# Patient Record
Sex: Female | Born: 1955 | Race: White | Hispanic: No | Marital: Married | Smoking: Never smoker
Health system: Southern US, Community
[De-identification: ages and names within clinical notes are randomized; demographics above are authoritative.]

## PROBLEM LIST (undated history)

## (undated) HISTORY — PX: ABDOMINAL HYSTERECTOMY: SHX81

## (undated) HISTORY — PX: OOPHORECTOMY: SHX86

---

## 2004-11-27 ENCOUNTER — Ambulatory Visit: Payer: Self-pay | Admitting: Unknown Physician Specialty

## 2005-11-30 ENCOUNTER — Ambulatory Visit: Payer: Self-pay | Admitting: Unknown Physician Specialty

## 2006-10-15 ENCOUNTER — Ambulatory Visit: Payer: Self-pay | Admitting: Unknown Physician Specialty

## 2006-12-02 ENCOUNTER — Ambulatory Visit: Payer: Self-pay | Admitting: Unknown Physician Specialty

## 2007-11-29 ENCOUNTER — Ambulatory Visit: Payer: Self-pay | Admitting: Unknown Physician Specialty

## 2009-03-28 ENCOUNTER — Ambulatory Visit: Payer: Self-pay | Admitting: Unknown Physician Specialty

## 2010-04-01 ENCOUNTER — Ambulatory Visit: Payer: Self-pay | Admitting: Unknown Physician Specialty

## 2011-04-15 ENCOUNTER — Ambulatory Visit: Payer: Self-pay | Admitting: Unknown Physician Specialty

## 2012-04-13 ENCOUNTER — Ambulatory Visit: Payer: Self-pay | Admitting: Internal Medicine

## 2012-04-22 ENCOUNTER — Ambulatory Visit: Payer: Self-pay | Admitting: Internal Medicine

## 2012-04-28 ENCOUNTER — Ambulatory Visit: Payer: Self-pay | Admitting: Orthopedic Surgery

## 2012-06-07 ENCOUNTER — Ambulatory Visit: Payer: Self-pay | Admitting: Unknown Physician Specialty

## 2013-06-14 ENCOUNTER — Ambulatory Visit: Payer: Self-pay | Admitting: Unknown Physician Specialty

## 2013-07-03 ENCOUNTER — Ambulatory Visit: Payer: Self-pay

## 2014-06-15 ENCOUNTER — Ambulatory Visit: Payer: Self-pay | Admitting: Internal Medicine

## 2014-10-08 DIAGNOSIS — I10 Essential (primary) hypertension: Secondary | ICD-10-CM | POA: Insufficient documentation

## 2014-10-08 DIAGNOSIS — E785 Hyperlipidemia, unspecified: Secondary | ICD-10-CM | POA: Insufficient documentation

## 2015-04-14 NOTE — Op Note (Signed)
PATIENT NAME:  Morgan Fernandez, Morgan Fernandez MR#:  [id number] DATE OF BIRTH:  [birth year]  DATE OF PROCEDURE:  04/28/2012  PREOPERATIVE DIAGNOSIS: Left comminuted distal radius fracture.   POSTOPERATIVE DIAGNOSIS: Left comminuted distal radius fracture.   PROCEDURE: Open reduction internal fixation left distal radius.   ANESTHESIA: General.   SURGEON: Leitha SchullerMichael J. Ginnifer Creelman, MD   DESCRIPTION OF PROCEDURE: The patient was brought to the operating room and after adequate anesthesia was obtained the left arm was prepped and draped in the usual sterile fashion with a tourniquet applied to the upper arm. After patient identification and time-out procedures were completed, the arm was exsanguinated with an Esmarch and the tourniquet raised. Fingertrap traction was applied to the index and middle finger and traction placed to restore length to the distal radius fragment. Volar approach was made and centered over the FCR tendon with the sheath incised and the tendon retracted ulnarly, the radial artery retracted radially. The pronator was identified and lifted off the distal fragment and shaft. With traction most of the length was restored and with some gentle manipulation near anatomic alignment could be obtained despite extensive comminution of the fracture. A short narrow DVR plate was then applied in the appropriate location with three screws placed proximally. This acted as a buttress and then smooth pegs to help maintain alignment in the distal fragments first drilling, measuring, and then placing the smooth pegs. After all had been tightened and adequate reduction on mini C-arm views as well as oblique views looking down the radial styloid and along the joint surface, there did not appear to be any pin penetration into the joint. Length was restored along with radial inclination and neutral volar tilt. The tourniquet was let down. Hemostasis was checked with electrocautery. The wound was closed with 3-0 Vicryl  subcutaneously and 4-0 nylon. Xeroform, 4 x 4's, Webril, and a volar splint were applied. The patient was sent to the recovery room in stable condition.   ESTIMATED BLOOD LOSS: Minimal.   COMPLICATIONS: None.   SPECIMEN: None.   TOURNIQUET TIME: 25 minutes at 250 mmHg.   IMPLANTS: [employer] short narrow DVR plate with multiple screws and pegs used.   ____________________________ Leitha SchullerMichael J. Adler Alton, MD mjm:drc D: 04/28/2012 22:15:17 ET T: 04/29/2012 09:11:31 ET JOB#: [id number]  cc: Leitha SchullerMichael J. Deaven Barron, MD, <Dictator> Leitha SchullerMICHAEL J Delrico Minehart MD ELECTRONICALLY SIGNED 04/29/2012 17:05

## 2015-05-23 ENCOUNTER — Other Ambulatory Visit: Payer: Self-pay | Admitting: Internal Medicine

## 2015-05-23 DIAGNOSIS — Z1231 Encounter for screening mammogram for malignant neoplasm of breast: Secondary | ICD-10-CM

## 2015-06-18 ENCOUNTER — Ambulatory Visit
Admission: RE | Admit: 2015-06-18 | Discharge: 2015-06-18 | Disposition: A | Discharge status: Home / Self Care | Payer: BLUE CROSS/BLUE SHIELD | Source: Ambulatory Visit | Attending: Internal Medicine | Admitting: Internal Medicine

## 2015-06-18 DIAGNOSIS — Z1231 Encounter for screening mammogram for malignant neoplasm of breast: Secondary | ICD-10-CM | POA: Diagnosis present

## 2015-10-15 DIAGNOSIS — R7401 Elevation of levels of liver transaminase levels: Secondary | ICD-10-CM | POA: Insufficient documentation

## 2015-10-15 DIAGNOSIS — R740 Nonspecific elevation of levels of transaminase and lactic acid dehydrogenase [LDH]: Secondary | ICD-10-CM

## 2016-04-15 ENCOUNTER — Other Ambulatory Visit: Payer: Self-pay | Admitting: Internal Medicine

## 2016-04-15 DIAGNOSIS — Z1239 Encounter for other screening for malignant neoplasm of breast: Secondary | ICD-10-CM

## 2016-06-18 ENCOUNTER — Ambulatory Visit
Admission: RE | Admit: 2016-06-18 | Discharge: 2016-06-18 | Disposition: A | Discharge status: Home / Self Care | Payer: BLUE CROSS/BLUE SHIELD | Source: Ambulatory Visit | Attending: Internal Medicine | Admitting: Internal Medicine

## 2016-06-18 DIAGNOSIS — Z1239 Encounter for other screening for malignant neoplasm of breast: Secondary | ICD-10-CM

## 2016-06-18 DIAGNOSIS — Z1231 Encounter for screening mammogram for malignant neoplasm of breast: Secondary | ICD-10-CM | POA: Insufficient documentation

## 2017-03-25 ENCOUNTER — Other Ambulatory Visit: Payer: Self-pay | Admitting: Internal Medicine

## 2017-03-25 DIAGNOSIS — Z1231 Encounter for screening mammogram for malignant neoplasm of breast: Secondary | ICD-10-CM

## 2017-06-28 ENCOUNTER — Ambulatory Visit
Admission: RE | Admit: 2017-06-28 | Discharge: 2017-06-28 | Disposition: A | Discharge status: Home / Self Care | Payer: BLUE CROSS/BLUE SHIELD | Source: Ambulatory Visit | Attending: Internal Medicine | Admitting: Internal Medicine

## 2017-06-28 DIAGNOSIS — Z1231 Encounter for screening mammogram for malignant neoplasm of breast: Secondary | ICD-10-CM

## 2018-04-14 ENCOUNTER — Other Ambulatory Visit: Payer: Self-pay | Admitting: Internal Medicine

## 2018-04-14 DIAGNOSIS — Z1231 Encounter for screening mammogram for malignant neoplasm of breast: Secondary | ICD-10-CM

## 2018-05-22 ENCOUNTER — Ambulatory Visit
Admission: EM | Admit: 2018-05-22 | Discharge: 2018-05-22 | Disposition: A | Discharge status: Home / Self Care | Payer: BLUE CROSS/BLUE SHIELD

## 2018-05-22 DIAGNOSIS — W57XXXA Bitten or stung by nonvenomous insect and other nonvenomous arthropods, initial encounter: Secondary | ICD-10-CM | POA: Diagnosis not present

## 2018-05-22 DIAGNOSIS — S50861A Insect bite (nonvenomous) of right forearm, initial encounter: Secondary | ICD-10-CM | POA: Diagnosis not present

## 2018-05-22 MED ORDER — LORATADINE 10 MG PO TABS: 10.0000 mg | ORAL_TABLET | Freq: Every day | ORAL | 0 refills | Status: DC

## 2018-05-22 MED ORDER — CEPHALEXIN 500 MG PO CAPS: 500.0000 mg | ORAL_CAPSULE | Freq: Four times a day (QID) | ORAL | 0 refills | Status: AC

## 2018-05-22 NOTE — ED Triage Notes (Signed)
Pt woke up to a bite on her right arm near her elbow. It is red, tender to the touch and swelling. Did take benadryl before she came.

## 2018-05-22 NOTE — Discharge Instructions (Addendum)
Please continue to monitor area of redness along the right forearm.  If redness exceeds skin pen mark, please start antibiotic.  For the next 24 hours please apply hydrocortisone cream, ice and take antihistamine medications.

## 2018-05-22 NOTE — ED Provider Notes (Signed)
[facility]    CSN: [id number] Arrival date & time: 05/22/18  1221     History   Chief Complaint Chief Complaint  Patient presents with  . Insect Bite    HPI Morgan Fernandez is a 62 y.o. female.   Presents to the urgent care facility for evaluation of right forearm warmth and redness.  Patient states sometime in the middle the night she thinks she was bitten by an insect or spider.  She has a welt on the forearm with some mild redness and warmth.  Pain is minimal.  She is applied some hydrocortisone cream with mild improvement.  She took one Benadryl tablet with mild relief.  Rash is pruritic.  She denies any fevers.  HPI  History reviewed. No pertinent past medical history.  Patient Active Problem List   Diagnosis Date Noted  . Elevated transaminase level 10/15/2015  . Benign essential hypertension 10/08/2014  . Hyperlipidemia 10/08/2014    Past Surgical History:  Procedure Laterality Date  . ABDOMINAL HYSTERECTOMY    . OOPHORECTOMY      OB History   None      Home Medications    Prior to Admission medications   Medication Sig Start Date End Date Taking? Authorizing Provider  Ascorbic Acid (VITAMIN C) 1000 MG tablet Take by mouth.   Yes [provider]  aspirin EC 81 MG tablet Take by mouth.   Yes [provider]  Cholecalciferol (VITAMIN D3) 1000 units CAPS Take by mouth.   Yes [provider]  hydrochlorothiazide (HYDRODIURIL) 25 MG tablet  05/04/18  Yes [provider]  metoprolol succinate (TOPROL-XL) 25 MG 24 hr tablet TAKE 2 TABLETS ONCE DAILY 04/27/18  Yes [provider]  Multiple Vitamin (MULTIVITAMIN) capsule Take by mouth.   Yes [provider]  simvastatin (ZOCOR) 20 MG tablet TAKE 1 TABLET NIGHTLY 02/28/18  Yes [provider]  VITAMIN B COMPLEX-C CAPS Take by mouth.   Yes [provider]  cephALEXin (KEFLEX) 500 MG capsule Take 1 capsule (500 mg total) by mouth 4  (four) times daily for 7 days. 05/22/18 05/29/18  Evon SlackGaines, Sanel Stemmer C, PA-C  loratadine (CLARITIN) 10 MG tablet Take 1 tablet (10 mg total) by mouth daily. 05/22/18   Evon SlackGaines, Kasson Lamere C, PA-C    Family History Family History  Problem Relation Age of Onset  . Breast cancer Paternal Aunt 6950    Social History Social History   Tobacco Use  . Smoking status: Never Smoker  . Smokeless tobacco: Never Used  Substance Use Topics  . Alcohol use: Not on file  . Drug use: Not on file     Allergies   Azithromycin; Codeine; Erythromycin; Morphine; and Sulfa antibiotics   Review of Systems Review of Systems  Constitutional: Negative for fever.  Musculoskeletal: Negative for arthralgias, joint swelling and myalgias.  Skin: Positive for rash.     Physical Exam Triage Vital Signs ED Triage Vitals  Enc Vitals Group     BP 05/22/18 1245 (!) 165/77     Pulse Rate 05/22/18 1245 64     Resp 05/22/18 1245 18     Temp 05/22/18 1245 98.3 F (36.8 C)     Temp Source 05/22/18 1245 Oral     SpO2 05/22/18 1245 100 %     Weight --      Height --      Head Circumference --      Peak Flow --  Pain Score 05/22/18 1247 0     Pain Loc --      Pain Edu? --      Excl. in GC? --    No data found.  Updated Vital Signs BP (!) 165/77 (BP Location: Right Arm)   Pulse 64   Temp 98.3 F (36.8 C) (Oral)   Resp 18   SpO2 100%   Visual Acuity Right Eye Distance:   Left Eye Distance:   Bilateral Distance:    Right Eye Near:   Left Eye Near:    Bilateral Near:     Physical Exam  Constitutional: She is oriented to person, place, and time. She appears well-developed and well-nourished.  HENT:  Head: Normocephalic and atraumatic.  Eyes: Conjunctivae are normal.  Neck: Normal range of motion.  Cardiovascular: Normal rate.  Pulmonary/Chest: Effort normal. No respiratory distress.  Musculoskeletal: Normal range of motion.  Right upper extremity with 11.5 x 8 cm area of erythema with minimal  induration.  There is a centralized area of increased induration with slight elevation with no fluctuance.  No necrotic tissue present.  Small papule in the center of the area of erythema with no pustule formation.  Forearm is soft, neurovascular intact.  Slightly erythematous with mild warmth.  Tissue blanches.  Neurological: She is alert and oriented to person, place, and time.  Skin: Skin is warm. No rash noted.  Psychiatric: She has a normal mood and affect. Her behavior is normal. Thought content normal.     UC Treatments / Results  Labs (all labs ordered are listed, but only abnormal results are displayed) Labs Reviewed - No data to display  EKG None  Radiology No results found.  Procedures Procedures (including critical care time)  Medications Ordered in UC Medications - No data to display  Initial Impression / Assessment and Plan / UC Course  I have reviewed the triage vital signs and the nursing notes.  Pertinent labs & imaging results that were available during my care of the patient were reviewed by me and considered in my medical decision making (see chart for details).     62 year old female with insect bite to the right forearm.  There is some erythema mild warmth most likely non-cellulitic.  Patient likely with inflammatory response from insect bite.  Recommend she use hydrocortisone cream, antihistamines and ice.  Area of redness is marked with a skin pen and if no improvement 24 hours she will start cephalexin. Final Clinical Impressions(s) / UC Diagnoses   Final diagnoses:  Insect bite of right forearm, initial encounter     Discharge Instructions     Please continue to monitor area of redness along the right forearm.  If redness exceeds skin pen mark, please start antibiotic.  For the next 24 hours please apply hydrocortisone cream, ice and take antihistamine medications.   ED Prescriptions    Medication Sig Dispense Auth. Provider   cephALEXin  (KEFLEX) 500 MG capsule Take 1 capsule (500 mg total) by mouth 4 (four) times daily for 7 days. 28 capsule Evon Slack, PA-C   loratadine (CLARITIN) 10 MG tablet Take 1 tablet (10 mg total) by mouth daily. 10 tablet Ronnette Juniper       Evon Slack, [place] 05/22/18 1321

## 2018-06-29 ENCOUNTER — Ambulatory Visit
Admission: RE | Admit: 2018-06-29 | Discharge: 2018-06-29 | Disposition: A | Discharge status: Home / Self Care | Payer: BLUE CROSS/BLUE SHIELD | Source: Ambulatory Visit | Attending: Internal Medicine | Admitting: Internal Medicine

## 2018-06-29 ENCOUNTER — Encounter (INDEPENDENT_AMBULATORY_CARE_PROVIDER_SITE_OTHER): Payer: Self-pay

## 2018-06-29 DIAGNOSIS — Z1231 Encounter for screening mammogram for malignant neoplasm of breast: Secondary | ICD-10-CM | POA: Insufficient documentation

## 2019-03-31 IMAGING — MG MM DIGITAL SCREENING BILAT W/ TOMO W/ CAD
8 of 12 series · 8 of 28 positions shown · non-contrast
Comparison: Previous exam(s).

CLINICAL DATA: Screening.

EXAM:
2D DIGITAL SCREENING BILATERAL MAMMOGRAM WITH CAD AND ADJUNCT TOMO

[R MLO synth-2D]
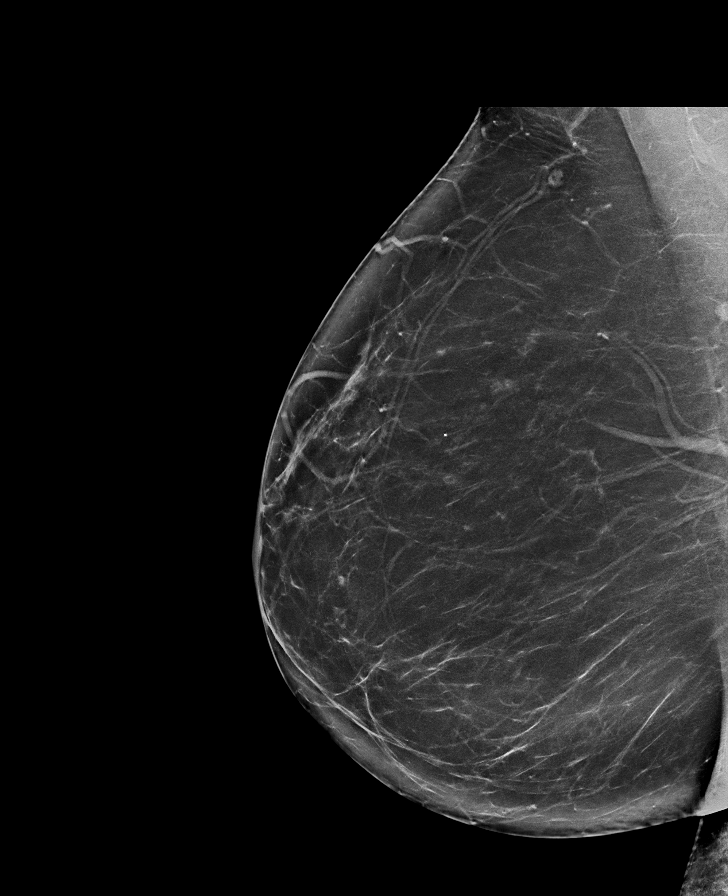

[L MLO]
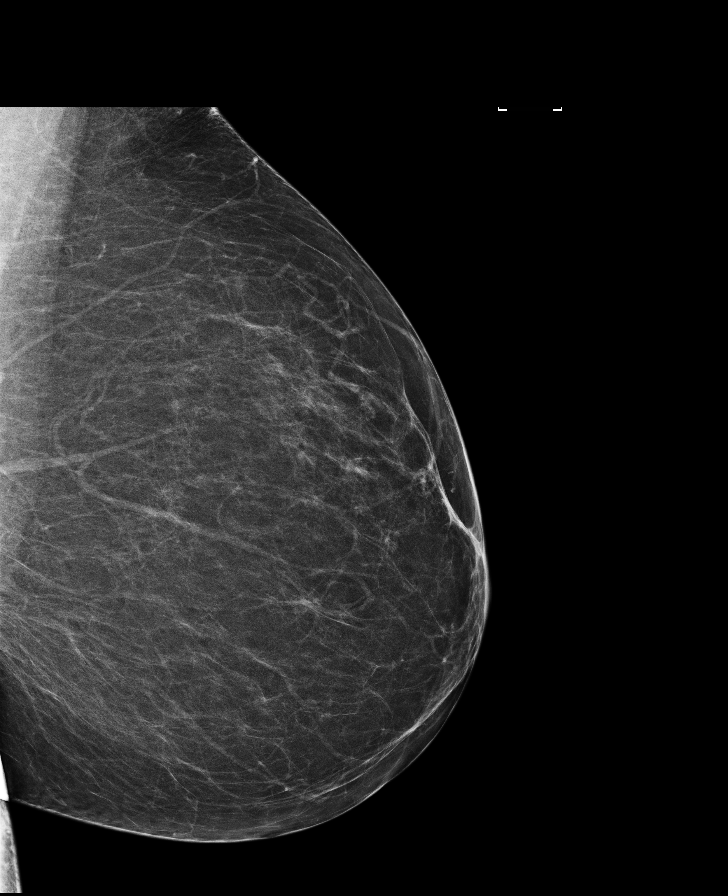

[L CC synth-2D]
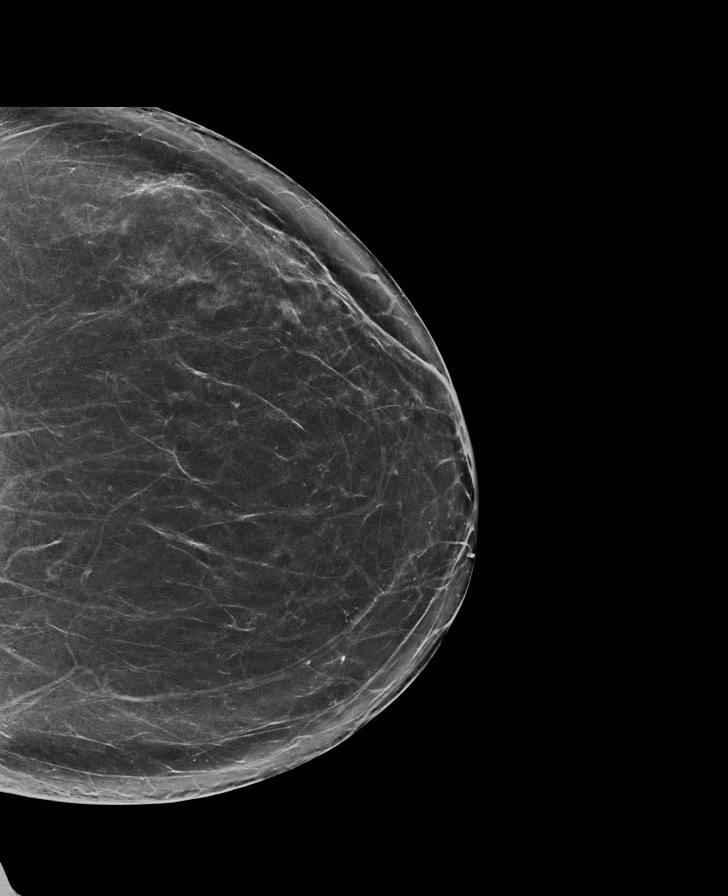

[R CC synth-2D]
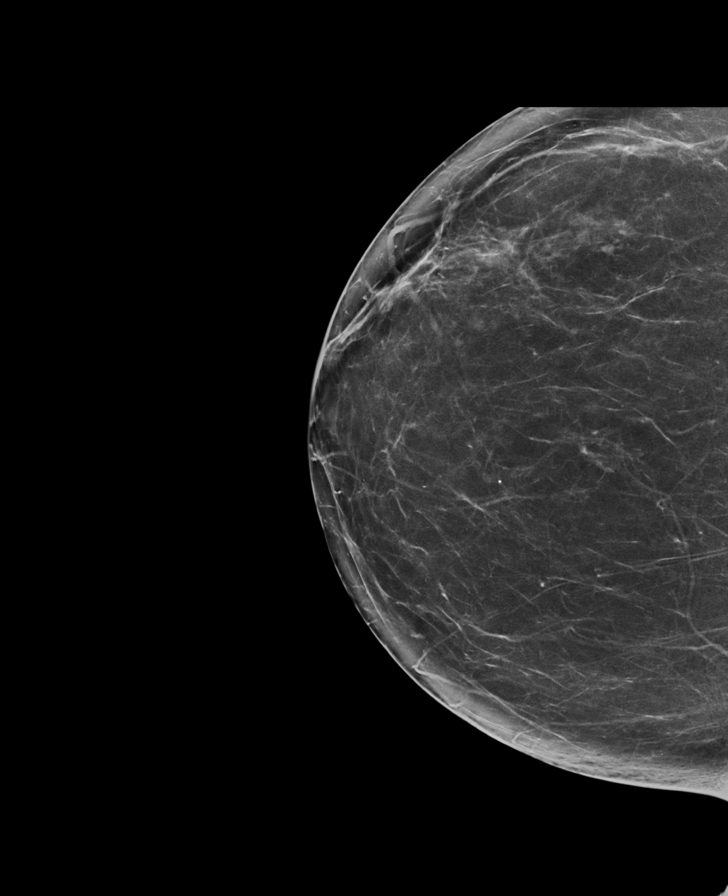

[R MLO]
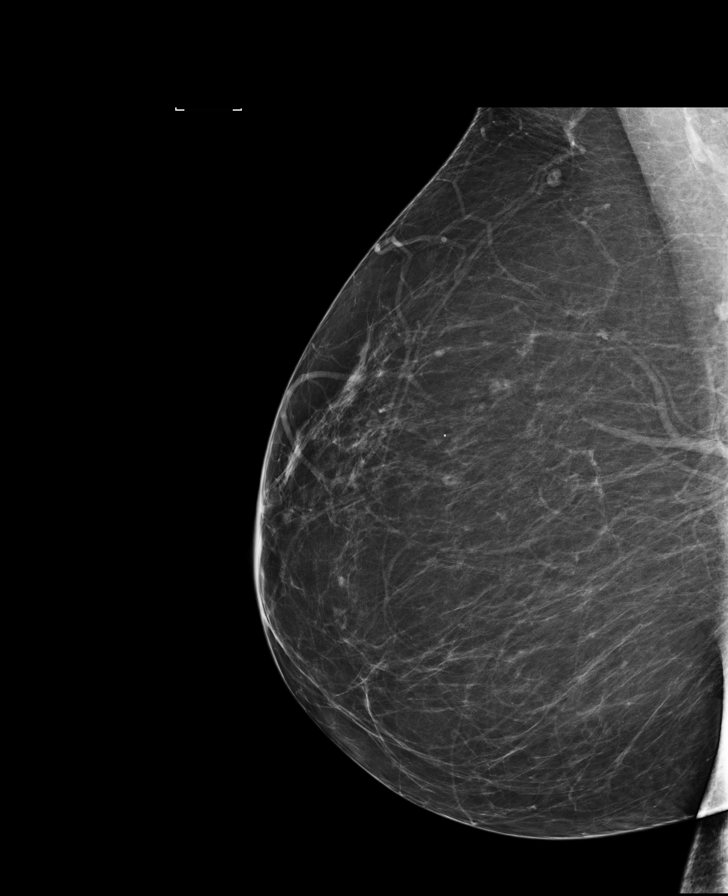

[L MLO synth-2D]
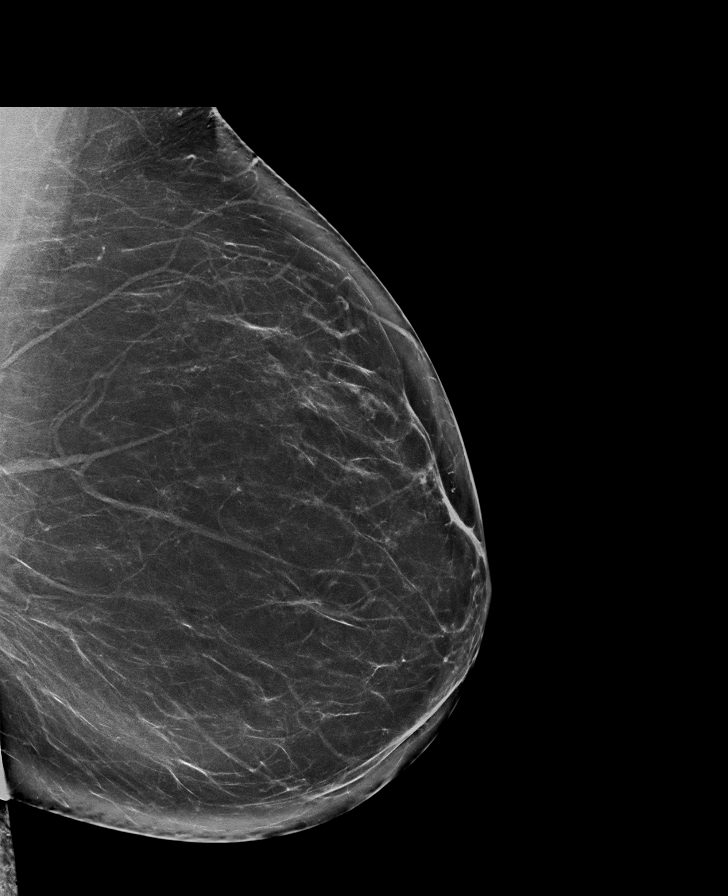

[L CC]
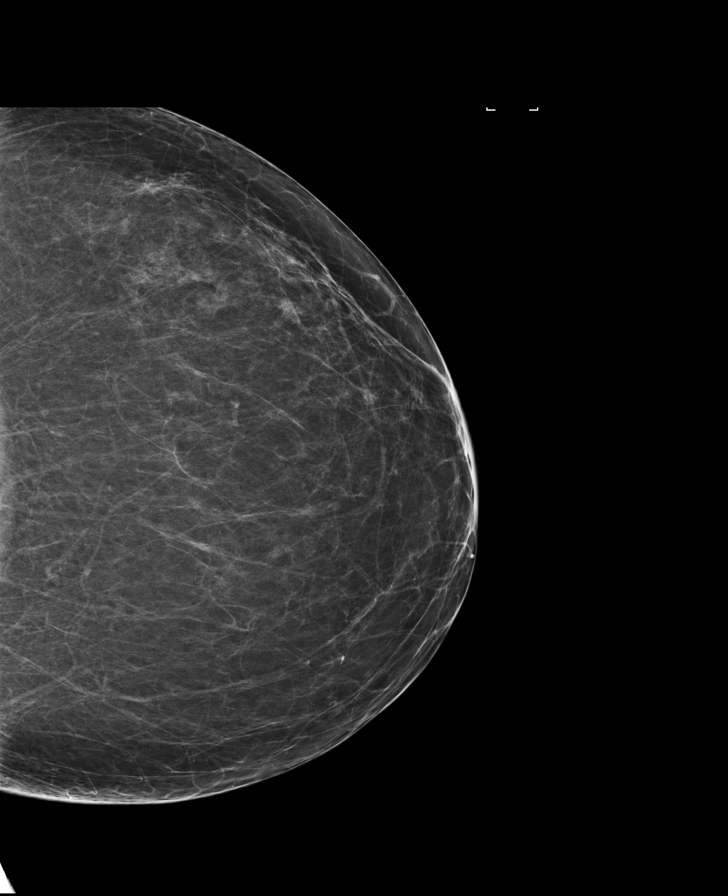

[R CC]
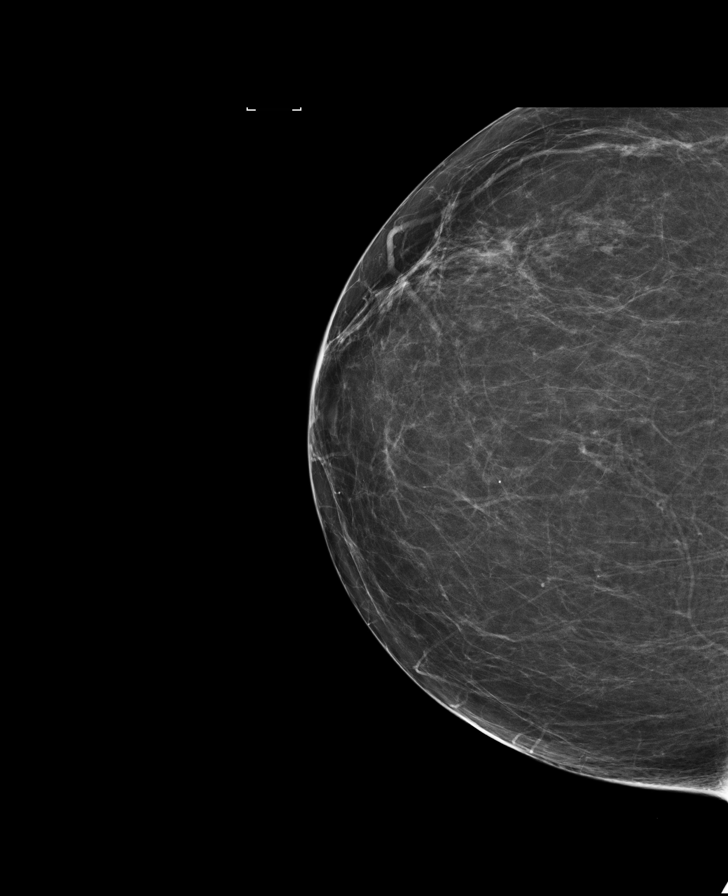

[8 of 28 positions shown; findings below may reference images not displayed]

ACR Breast Density Category b: There are scattered areas of
fibroglandular density.
FINDINGS: There are no findings suspicious for malignancy. Images were
processed with CAD.
IMPRESSION: No mammographic evidence of malignancy. A result letter of this
screening mammogram will be mailed directly to the patient.

RECOMMENDATION:
Screening mammogram in one year. (Code:97-6-RS4)

BI-RADS CATEGORY  1: Negative.

## 2019-05-23 ENCOUNTER — Other Ambulatory Visit: Payer: Self-pay | Admitting: Family Medicine

## 2019-05-23 DIAGNOSIS — Z1231 Encounter for screening mammogram for malignant neoplasm of breast: Secondary | ICD-10-CM

## 2019-07-03 ENCOUNTER — Ambulatory Visit
Admission: RE | Admit: 2019-07-03 | Discharge: 2019-07-03 | Disposition: A | Discharge status: Home / Self Care | Payer: BC Managed Care – PPO | Source: Ambulatory Visit | Attending: Family Medicine | Admitting: Family Medicine

## 2019-07-03 ENCOUNTER — Other Ambulatory Visit: Payer: Self-pay

## 2019-07-03 DIAGNOSIS — Z1231 Encounter for screening mammogram for malignant neoplasm of breast: Secondary | ICD-10-CM | POA: Insufficient documentation

## 2020-05-01 ENCOUNTER — Other Ambulatory Visit: Payer: Self-pay | Admitting: Family Medicine

## 2020-05-01 DIAGNOSIS — Z1231 Encounter for screening mammogram for malignant neoplasm of breast: Secondary | ICD-10-CM

## 2020-07-03 ENCOUNTER — Ambulatory Visit
Admission: RE | Admit: 2020-07-03 | Discharge: 2020-07-03 | Disposition: A | Discharge status: Home / Self Care | Payer: Medicare HMO | Source: Ambulatory Visit | Attending: Family Medicine | Admitting: Family Medicine

## 2020-07-03 ENCOUNTER — Other Ambulatory Visit: Payer: Self-pay

## 2020-07-03 DIAGNOSIS — Z1231 Encounter for screening mammogram for malignant neoplasm of breast: Secondary | ICD-10-CM | POA: Diagnosis not present

## 2021-06-02 ENCOUNTER — Other Ambulatory Visit: Payer: Self-pay | Admitting: Family Medicine

## 2021-06-02 DIAGNOSIS — Z1231 Encounter for screening mammogram for malignant neoplasm of breast: Secondary | ICD-10-CM

## 2021-07-07 ENCOUNTER — Other Ambulatory Visit: Payer: Self-pay

## 2021-07-07 ENCOUNTER — Ambulatory Visit
Admission: RE | Admit: 2021-07-07 | Discharge: 2021-07-07 | Disposition: A | Discharge status: Home / Self Care | Payer: Medicare HMO | Source: Ambulatory Visit | Attending: Family Medicine | Admitting: Family Medicine

## 2021-07-07 DIAGNOSIS — Z1231 Encounter for screening mammogram for malignant neoplasm of breast: Secondary | ICD-10-CM | POA: Diagnosis present

## 2022-01-22 DIAGNOSIS — Z780 Asymptomatic menopausal state: Secondary | ICD-10-CM | POA: Diagnosis not present

## 2022-01-23 DIAGNOSIS — H2513 Age-related nuclear cataract, bilateral: Secondary | ICD-10-CM | POA: Diagnosis not present

## 2022-03-05 DIAGNOSIS — E7800 Pure hypercholesterolemia, unspecified: Secondary | ICD-10-CM | POA: Diagnosis not present

## 2022-03-05 DIAGNOSIS — Z780 Asymptomatic menopausal state: Secondary | ICD-10-CM | POA: Diagnosis not present

## 2022-03-05 DIAGNOSIS — I10 Essential (primary) hypertension: Secondary | ICD-10-CM | POA: Diagnosis not present

## 2022-03-12 DIAGNOSIS — Z9189 Other specified personal risk factors, not elsewhere classified: Secondary | ICD-10-CM | POA: Diagnosis not present

## 2022-03-12 DIAGNOSIS — E7800 Pure hypercholesterolemia, unspecified: Secondary | ICD-10-CM | POA: Diagnosis not present

## 2022-03-12 DIAGNOSIS — Z0000 Encounter for general adult medical examination without abnormal findings: Secondary | ICD-10-CM | POA: Diagnosis not present

## 2022-03-12 DIAGNOSIS — Z23 Encounter for immunization: Secondary | ICD-10-CM | POA: Diagnosis not present

## 2022-03-12 DIAGNOSIS — I10 Essential (primary) hypertension: Secondary | ICD-10-CM | POA: Diagnosis not present

## 2022-03-12 DIAGNOSIS — Z780 Asymptomatic menopausal state: Secondary | ICD-10-CM | POA: Diagnosis not present

## 2022-05-25 ENCOUNTER — Other Ambulatory Visit: Payer: Self-pay | Admitting: Internal Medicine

## 2022-05-25 ENCOUNTER — Other Ambulatory Visit: Payer: Self-pay | Admitting: Family Medicine

## 2022-05-25 DIAGNOSIS — Z1231 Encounter for screening mammogram for malignant neoplasm of breast: Secondary | ICD-10-CM

## 2022-06-17 DIAGNOSIS — J019 Acute sinusitis, unspecified: Secondary | ICD-10-CM | POA: Diagnosis not present

## 2022-07-09 ENCOUNTER — Ambulatory Visit
Admission: RE | Admit: 2022-07-09 | Discharge: 2022-07-09 | Disposition: A | Discharge status: Home / Self Care | Payer: PPO | Source: Ambulatory Visit | Attending: Internal Medicine | Admitting: Internal Medicine

## 2022-07-09 DIAGNOSIS — Z1231 Encounter for screening mammogram for malignant neoplasm of breast: Secondary | ICD-10-CM | POA: Diagnosis not present

## 2022-10-15 DIAGNOSIS — E7800 Pure hypercholesterolemia, unspecified: Secondary | ICD-10-CM | POA: Diagnosis not present

## 2022-10-15 DIAGNOSIS — I10 Essential (primary) hypertension: Secondary | ICD-10-CM | POA: Diagnosis not present

## 2022-10-21 DIAGNOSIS — E559 Vitamin D deficiency, unspecified: Secondary | ICD-10-CM | POA: Diagnosis not present

## 2022-10-21 DIAGNOSIS — I10 Essential (primary) hypertension: Secondary | ICD-10-CM | POA: Diagnosis not present

## 2022-10-21 DIAGNOSIS — E7800 Pure hypercholesterolemia, unspecified: Secondary | ICD-10-CM | POA: Diagnosis not present

## 2023-02-16 DIAGNOSIS — H2513 Age-related nuclear cataract, bilateral: Secondary | ICD-10-CM | POA: Diagnosis not present

## 2023-02-16 DIAGNOSIS — H5213 Myopia, bilateral: Secondary | ICD-10-CM | POA: Diagnosis not present

## 2023-02-16 DIAGNOSIS — H524 Presbyopia: Secondary | ICD-10-CM | POA: Diagnosis not present

## 2023-06-07 ENCOUNTER — Other Ambulatory Visit: Payer: Self-pay | Admitting: Internal Medicine

## 2023-06-07 DIAGNOSIS — Z1231 Encounter for screening mammogram for malignant neoplasm of breast: Secondary | ICD-10-CM

## 2023-07-12 ENCOUNTER — Ambulatory Visit
Admission: RE | Admit: 2023-07-12 | Discharge: 2023-07-12 | Disposition: A | Discharge status: Home / Self Care | Payer: Medicare HMO | Source: Ambulatory Visit | Attending: Internal Medicine | Admitting: Internal Medicine

## 2023-07-12 DIAGNOSIS — Z1231 Encounter for screening mammogram for malignant neoplasm of breast: Secondary | ICD-10-CM | POA: Diagnosis not present

## 2024-04-21 ENCOUNTER — Other Ambulatory Visit: Payer: Self-pay | Admitting: Orthopedic Surgery

## 2024-04-21 DIAGNOSIS — M19011 Primary osteoarthritis, right shoulder: Secondary | ICD-10-CM

## 2024-04-25 ENCOUNTER — Encounter: Payer: Self-pay | Admitting: Orthopedic Surgery

## 2024-04-29 ENCOUNTER — Ambulatory Visit
Admission: RE | Admit: 2024-04-29 | Discharge: 2024-04-29 | Disposition: A | Discharge status: Home / Self Care | Source: Ambulatory Visit | Attending: Orthopedic Surgery | Admitting: Orthopedic Surgery

## 2024-04-29 DIAGNOSIS — M19011 Primary osteoarthritis, right shoulder: Secondary | ICD-10-CM

## 2024-06-01 ENCOUNTER — Other Ambulatory Visit: Payer: Self-pay | Admitting: Internal Medicine

## 2024-06-01 DIAGNOSIS — Z1231 Encounter for screening mammogram for malignant neoplasm of breast: Secondary | ICD-10-CM

## 2024-06-20 ENCOUNTER — Ambulatory Visit
Admission: RE | Admit: 2024-06-20 | Discharge: 2024-06-20 | Disposition: A | Discharge status: Home / Self Care | Source: Ambulatory Visit | Attending: Internal Medicine | Admitting: Internal Medicine

## 2024-06-20 ENCOUNTER — Other Ambulatory Visit: Payer: Self-pay | Admitting: Internal Medicine

## 2024-06-20 DIAGNOSIS — M7989 Other specified soft tissue disorders: Secondary | ICD-10-CM

## 2024-06-22 ENCOUNTER — Other Ambulatory Visit: Payer: Self-pay | Admitting: Surgery

## 2024-06-28 ENCOUNTER — Inpatient Hospital Stay: Admission: RE | Admit: 2024-06-28 | Source: Ambulatory Visit

## 2024-06-29 ENCOUNTER — Ambulatory Visit
Admission: RE | Admit: 2024-06-29 | Discharge: 2024-06-29 | Disposition: A | Discharge status: Home / Self Care | Source: Ambulatory Visit | Attending: Internal Medicine | Admitting: Internal Medicine

## 2024-06-29 DIAGNOSIS — Z1231 Encounter for screening mammogram for malignant neoplasm of breast: Secondary | ICD-10-CM | POA: Insufficient documentation

## 2024-07-05 ENCOUNTER — Other Ambulatory Visit: Payer: Self-pay | Admitting: Internal Medicine

## 2024-07-05 DIAGNOSIS — R928 Other abnormal and inconclusive findings on diagnostic imaging of breast: Secondary | ICD-10-CM

## 2024-07-06 ENCOUNTER — Ambulatory Visit: Admit: 2024-07-06 | Admitting: Surgery

## 2024-07-06 ENCOUNTER — Inpatient Hospital Stay
Admission: RE | Admit: 2024-07-06 | Discharge: 2024-07-06 | Discharge status: Home / Self Care | Source: Ambulatory Visit | Attending: Internal Medicine | Admitting: Internal Medicine

## 2024-07-06 ENCOUNTER — Ambulatory Visit
Admission: RE | Admit: 2024-07-06 | Discharge: 2024-07-06 | Disposition: A | Discharge status: Home / Self Care | Source: Ambulatory Visit | Attending: Internal Medicine | Admitting: Internal Medicine

## 2024-07-06 DIAGNOSIS — R928 Other abnormal and inconclusive findings on diagnostic imaging of breast: Secondary | ICD-10-CM

## 2024-07-06 SURGERY — ARTHROPLASTY, SHOULDER, TOTAL, REVERSE
Anesthesia: Choice | Site: Shoulder | Laterality: Right
